# Patient Record
Sex: Female | Born: 1940 | Race: White | Hispanic: No | Smoking: Never smoker
Health system: Southern US, Community
[De-identification: ages and names within clinical notes are randomized; demographics above are authoritative.]

## PROBLEM LIST (undated history)

## (undated) DIAGNOSIS — I2510 Atherosclerotic heart disease of native coronary artery without angina pectoris: Secondary | ICD-10-CM

## (undated) DIAGNOSIS — I10 Essential (primary) hypertension: Secondary | ICD-10-CM

## (undated) DIAGNOSIS — M1990 Unspecified osteoarthritis, unspecified site: Secondary | ICD-10-CM

## (undated) HISTORY — PX: ABDOMINAL HYSTERECTOMY: SHX81

## (undated) HISTORY — PX: ARTERY REPAIR: SHX559

## (undated) HISTORY — PX: CARDIAC SURGERY: SHX584

---

## 2014-12-24 ENCOUNTER — Ambulatory Visit: Payer: Medicare Other

## 2014-12-24 ENCOUNTER — Encounter: Payer: Self-pay | Admitting: Gynecology

## 2014-12-24 ENCOUNTER — Ambulatory Visit
Admission: EM | Admit: 2014-12-24 | Discharge: 2014-12-24 | Disposition: A | Discharge status: Home / Self Care | Payer: Medicare Other | Attending: Internal Medicine | Admitting: Internal Medicine

## 2014-12-24 DIAGNOSIS — M79641 Pain in right hand: Secondary | ICD-10-CM | POA: Diagnosis present

## 2014-12-24 DIAGNOSIS — W19XXXA Unspecified fall, initial encounter: Secondary | ICD-10-CM | POA: Diagnosis not present

## 2014-12-24 DIAGNOSIS — S60211A Contusion of right wrist, initial encounter: Secondary | ICD-10-CM | POA: Diagnosis not present

## 2014-12-24 DIAGNOSIS — I10 Essential (primary) hypertension: Secondary | ICD-10-CM | POA: Diagnosis not present

## 2014-12-24 DIAGNOSIS — I2510 Atherosclerotic heart disease of native coronary artery without angina pectoris: Secondary | ICD-10-CM | POA: Diagnosis not present

## 2014-12-24 DIAGNOSIS — Z79899 Other long term (current) drug therapy: Secondary | ICD-10-CM | POA: Insufficient documentation

## 2014-12-24 HISTORY — DX: Essential (primary) hypertension: I10

## 2014-12-24 HISTORY — DX: Atherosclerotic heart disease of native coronary artery without angina pectoris: I25.10

## 2014-12-24 HISTORY — DX: Unspecified osteoarthritis, unspecified site: M19.90

## 2014-12-24 NOTE — ED Notes (Signed)
Patient c/o while walking her dogs x yesterday trip on side walk and landed on her right hand. Per patient swollen and slightly painful.

## 2014-12-24 NOTE — Discharge Instructions (Signed)
xrays of hand and wrist today did not show fracture, but stiffness of wrist is a little concerning. If stiffness/pain in the wrist are not markedly improved in 5-7 days, have wrist rechecked. Wear splint all the time for the next 5-7 days. Ice for 10-15 minutes several times daily for the next 24-48 hours should help with swelling and pain.  Tylenol or advil may also be helpful for pain.

## 2014-12-24 NOTE — ED Provider Notes (Addendum)
CSN: [id number]     Arrival date & time 12/24/14  0907 History   First MD Initiated Contact with Patient 12/24/14 1010     Chief Complaint  Patient presents with  . Fall   HPI  Patient is a 74 year old lady who was walking her dog yesterday, and came off balance when the dog tugged her unexpectedly. She fell forward, and has had some bruising and discomfort in the right dorsal hand. Also has restriction of wrist motion on the right. No discomfort or difficulty with motion of the elbow, shoulder, or other sites. She feels fine otherwise, and was able to walk independently into the urgent care today.  Past Medical History  Diagnosis Date  . Arthritis   . Coronary artery disease   . Hypertension    Past Surgical History  Procedure Laterality Date  . Cardiac surgery      peacemaker  . Artery repair    . Abdominal hysterectomy      Social History  Substance Use Topics  . Smoking status: Never Smoker   . Smokeless tobacco: None  . Alcohol Use: No    Review of Systems  All other systems reviewed and are negative.   Allergies  Dye fdc red and Vasotec  Home Medications   Prior to Admission medications   Medication Sig Start Date End Date Taking? Authorizing Provider  amLODipine (NORVASC) 10 MG tablet Take 10 mg by mouth daily.   Yes Historical Provider, MD  atorvastatin (LIPITOR) 80 MG tablet Take 80 mg by mouth daily.   Yes Historical Provider, MD  furosemide (LASIX) 20 MG tablet Take 20 mg by mouth.   Yes Historical Provider, MD  losartan (COZAAR) 100 MG tablet Take 100 mg by mouth daily.   Yes Historical Provider, MD   BP 156/50 mmHg  Pulse 60  Temp(Src) 97.9 F (36.6 C) (Oral)  Resp 16  Ht 4\' 11"  (1.499 m)  Wt 148 lb (67.132 kg)  BMI 29.88 kg/m2  SpO2 98%   Physical Exam  Constitutional: She is oriented to person, place, and time. No distress.  Alert, nicely groomed  HENT:  Head: Atraumatic.  Eyes:  Conjugate gaze, no eye redness/drainage  Neck:  Turns  head freely  Cardiovascular: Normal rate.   Pulmonary/Chest: No respiratory distress.  Abdominal: She exhibits no distension.  Musculoskeletal:  Right dorsal hand is quite swollen compared to the left, both are purpuric. No palmar bruising evident on the right. No focal tenderness of the right hand, some restriction of ability to make a fist due to swelling. Right wrist is tender over the distal radius, and there is marked restriction of wrist motion (flexion/extension).  Neurological: She is alert and oriented to person, place, and time.  Skin: Skin is warm and dry.  No cyanosis  Nursing note and vitals reviewed.   ED Course  Procedures (including critical care time)  Imaging Review Dg Wrist Complete Right  12/24/2014  CLINICAL DATA:  Pt with right wrist pain after a fall yesterday. No hx of previous injury or fracture. No hx of surgery. Pain is "all over" the right wrist area. EXAM: RIGHT WRIST - COMPLETE 3+ VIEW COMPARISON:  None. FINDINGS: No fracture. The wrist joints are normally spaced and aligned. No significant arthropathic change. Bones are demineralized.  Soft tissues are unremarkable. IMPRESSION: No fracture or dislocation. Electronically Signed   By: Amie Portlandavid  Ormond M.D.   On: 12/24/2014 10:50   Dg Hand Complete Right  12/24/2014  CLINICAL DATA:  Fall yesterday. Right hand pain with decreased range of motion. EXAM: RIGHT HAND - COMPLETE 3+ VIEW COMPARISON:  None. FINDINGS: No fracture. Joints are normally aligned. 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 Bones are demineralized.  Soft tissues are unremarkable. IMPRESSION: No fracture or dislocation. Electronically Signed   By: Amie Portland M.D.   On: 12/24/2014 10:51        MDM   1. Contusion of right wrist, initial encounter    Splinted.  Clinical suspicion moderate for occult fracture due to limitation of wrist motion, and age 74. Recheck or FU pcp or orthopedics  if pain/swelling not starting to improve in several days.    Eustace Moore, MD 12/25/14 2007  Eustace Moore, MD 12/25/14 2008

## 2016-03-23 IMAGING — CR DG HAND COMPLETE 3+V*R*
3 series · 3 of 3 positions shown · non-contrast
Comparison: None.

CLINICAL DATA: Fall yesterday. Right hand pain with decreased range
of motion.

EXAM:
RIGHT HAND - COMPLETE 3+ VIEW

[hand ap]
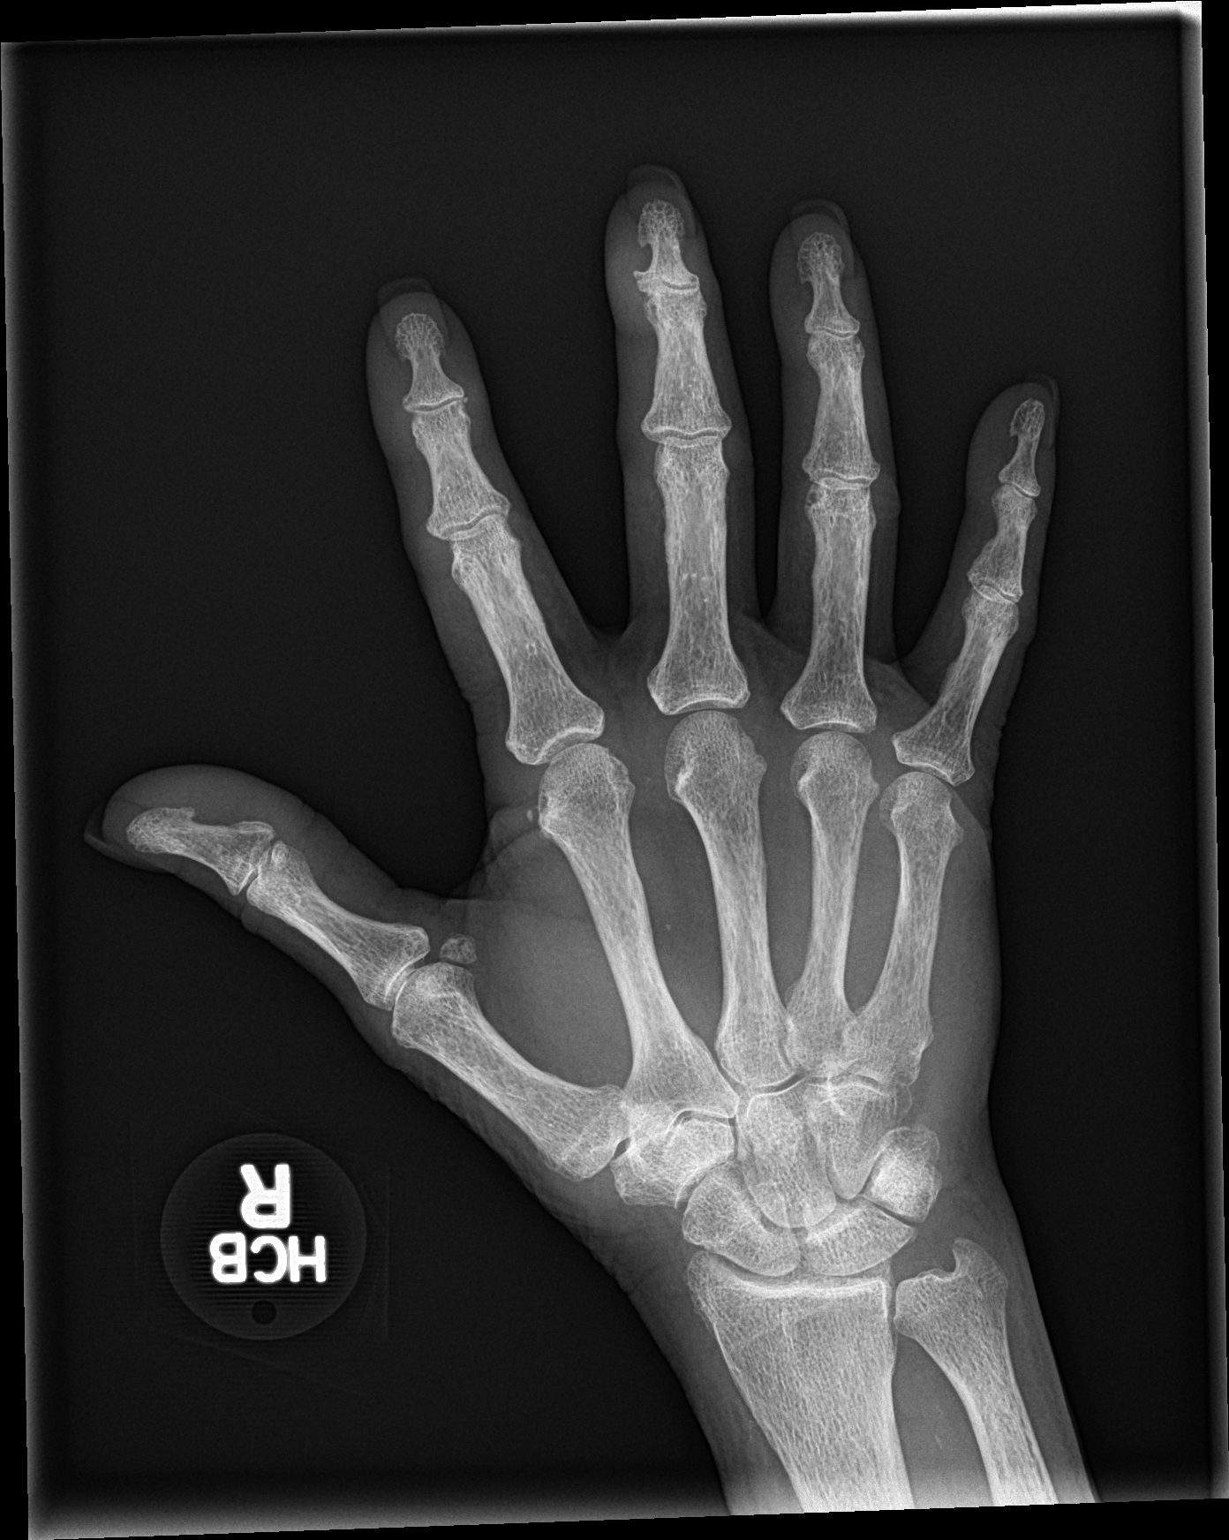

[hand obl]
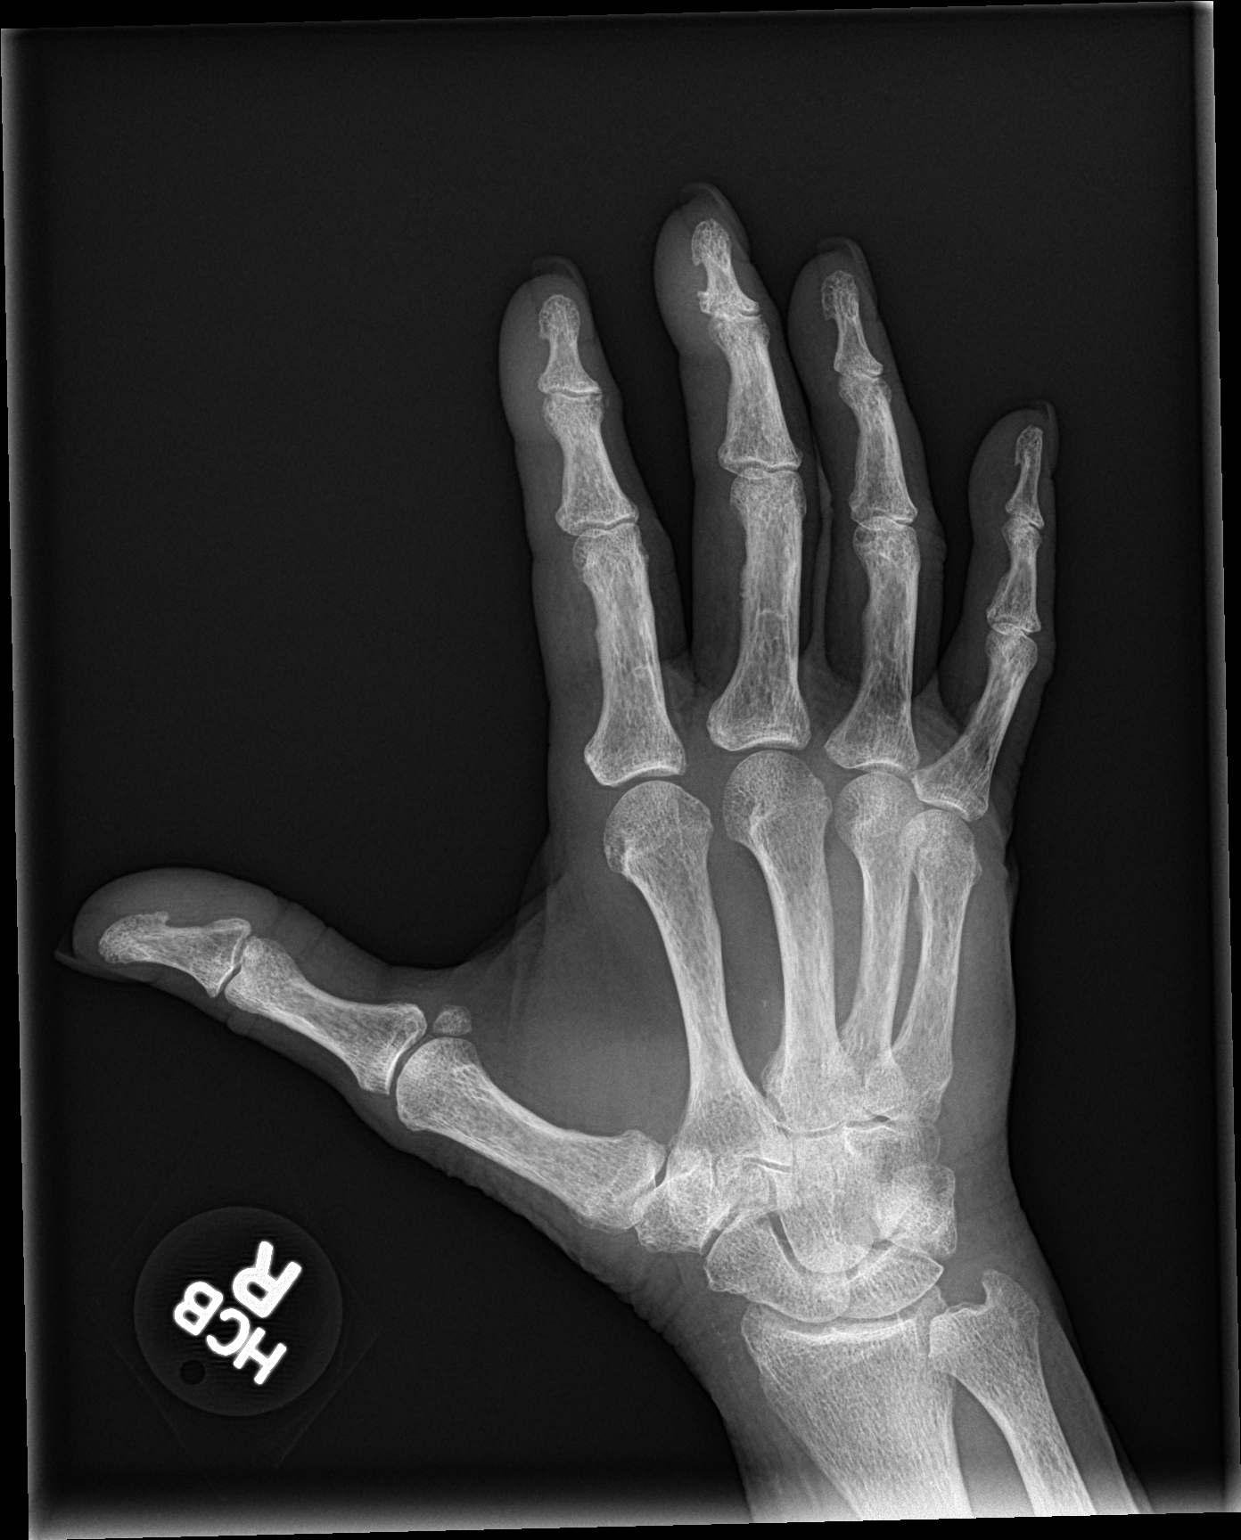

[hand lat]
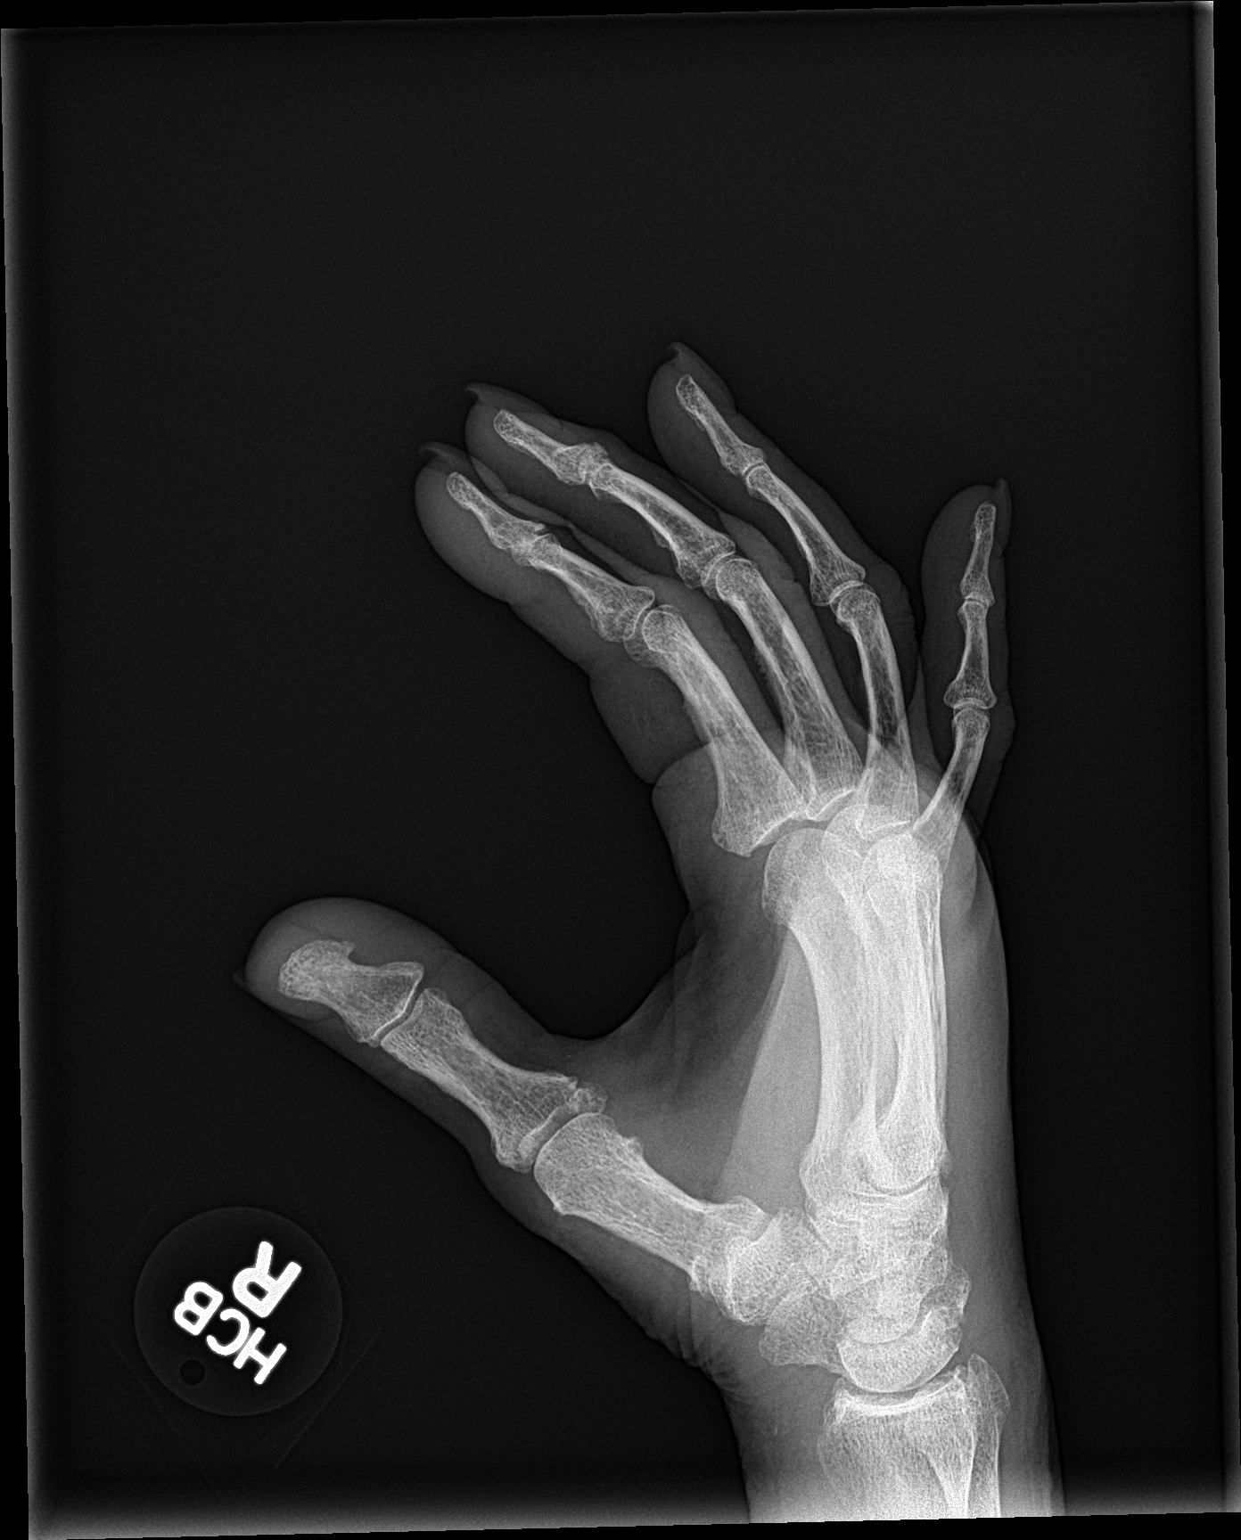

[3 of 3 positions shown; findings below may reference images not displayed]

FINDINGS: No fracture. Joints are normally aligned. There are mild
osteoarthritic changes most evident involving the DIP joint of the
middle finger.

Bones are demineralized.  Soft tissues are unremarkable.
IMPRESSION: No fracture or dislocation.
